# Patient Record
Sex: Female | Born: 2005
Health system: Southern US, Community
[De-identification: ages and names within clinical notes are randomized; demographics above are authoritative.]

## PROBLEM LIST (undated history)

## (undated) DIAGNOSIS — F32A Depression, unspecified: Secondary | ICD-10-CM

## (undated) DIAGNOSIS — F419 Anxiety disorder, unspecified: Secondary | ICD-10-CM

## (undated) DIAGNOSIS — T7840XA Allergy, unspecified, initial encounter: Secondary | ICD-10-CM

## (undated) DIAGNOSIS — D649 Anemia, unspecified: Secondary | ICD-10-CM

## (undated) DIAGNOSIS — Z91018 Allergy to other foods: Secondary | ICD-10-CM

## (undated) HISTORY — DX: Anxiety disorder, unspecified: F41.9

## (undated) HISTORY — DX: Anemia, unspecified: D64.9

## (undated) HISTORY — DX: Allergy, unspecified, initial encounter: T78.40XA

## (undated) HISTORY — DX: Depression, unspecified: F32.A

---
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
## 2024-02-06 ENCOUNTER — Ambulatory Visit: Payer: Self-pay

## 2024-02-06 ENCOUNTER — Ambulatory Visit: Admitting: Physician Assistant

## 2024-02-06 NOTE — Telephone Encounter (Signed)
 Appt tomorrow.

## 2024-02-06 NOTE — Telephone Encounter (Signed)
 FYI Only or Action Required?: FYI only for provider.  Patient was last seen in primary care on 06/15/2023 by Dorothy Lukes, PA.  Called Nurse Triage reporting Stye.  Symptoms began several days ago.  Interventions attempted: OTC medications: Ibuprofen and Ice/heat application.  Symptoms are: gradually worsening.  Triage Disposition: See Physician Within 24 Hours  Patient/caregiver understands and will follow disposition?: Yes    Copied from CRM [id number]. Topic: Clinical - Red Word Triage >> Feb 06, 2024 11:00 AM Dorothy Scott wrote: Red Word that prompted transfer to Nurse Triage: Patient called in stating that she has a sty in her eye and she thinks its infected. Reason for Disposition  [1] Eyelid is very swollen AND [2] no fever  Answer Assessment - Initial Assessment Questions 1. LOCATION: Which eye has the sty? Upper or lower eyelid?     Right eye, lower eyelid  2. SIZE: How big is it? (Note: standard pencil eraser is 6 mm)     Pretty big  3. EYELID: Is the eyelid swollen? If Yes, ask: How much?     Yes, very swollen  4. REDNESS: Has the redness spread onto the eyelid?      . 5. ONSET: When did you notice the sty?     Started Thursday (4 days ago)  6. VISION: Do you have blurred vision?      No  7. PAIN: Is it painful? If Yes, ask: How bad is the pain?  (Scale 1-10; or mild, moderate, severe)     Varies, but has been an 8. Hurts more to close and touch the eye  8. CONTACTS: Do you wear contacts?     No  9. OTHER SYMPTOMS: Do you have any other symptoms? (e.g., fever)     No  10. PREGNANCY: Is there any chance you are pregnant? When was your last menstrual period?       Lmp- June 27  Protocols used: Sty-A-AH

## 2024-02-07 ENCOUNTER — Ambulatory Visit: Admitting: Internal Medicine

## 2024-05-02 ENCOUNTER — Other Ambulatory Visit: Payer: Self-pay | Admitting: Medical Genetics

## 2024-05-02 DIAGNOSIS — Z006 Encounter for examination for normal comparison and control in clinical research program: Secondary | ICD-10-CM

## 2024-08-16 ENCOUNTER — Encounter: Payer: Self-pay | Admitting: Physician Assistant
